# Patient Record
Sex: Male | Born: 1975 | Race: Black or African American | Hispanic: No | Marital: Single | Smoking: Current some day smoker
Health system: Southern US, Community
[De-identification: ages and names within clinical notes are randomized; demographics above are authoritative.]

## PROBLEM LIST (undated history)

## (undated) DIAGNOSIS — I10 Essential (primary) hypertension: Secondary | ICD-10-CM

## (undated) DIAGNOSIS — E789 Disorder of lipoprotein metabolism, unspecified: Secondary | ICD-10-CM

---

## 2006-04-25 ENCOUNTER — Emergency Department (HOSPITAL_COMMUNITY): Admission: EM | Admit: 2006-04-25 | Discharge: 2006-04-25 | Payer: Self-pay | Admitting: Emergency Medicine

## 2008-03-23 ENCOUNTER — Inpatient Hospital Stay (HOSPITAL_COMMUNITY): Admission: EM | Admit: 2008-03-23 | Discharge: 2008-03-24 | Payer: Self-pay | Admitting: Emergency Medicine

## 2008-05-05 ENCOUNTER — Ambulatory Visit (HOSPITAL_BASED_OUTPATIENT_CLINIC_OR_DEPARTMENT_OTHER): Admission: RE | Admit: 2008-05-05 | Discharge: 2008-05-05 | Payer: Self-pay | Admitting: Otolaryngology
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
## 2010-06-13 ENCOUNTER — Emergency Department (HOSPITAL_COMMUNITY): Admission: EM | Admit: 2010-06-13 | Discharge: 2010-06-13 | Payer: Self-pay | Admitting: Emergency Medicine

## 2011-01-14 LAB — DIFFERENTIAL
Basophils Absolute: 0.1 10*3/uL (ref 0.0–0.1)
Basophils Relative: 1 % (ref 0–1)
Eosinophils Relative: 1 % (ref 0–5)
Lymphocytes Relative: 43 % (ref 12–46)
Lymphs Abs: 6.4 10*3/uL — ABNORMAL HIGH (ref 0.7–4.0)
Monocytes Absolute: 1 10*3/uL (ref 0.1–1.0)
Neutro Abs: 7.2 10*3/uL (ref 1.7–7.7)

## 2011-01-14 LAB — BLOOD GAS, ARTERIAL
Acid-base deficit: 1 mmol/L (ref 0.0–2.0)
O2 Saturation: 93.3 %
Patient temperature: 37
pO2, Arterial: 64.6 mmHg — ABNORMAL LOW (ref 80.0–100.0)

## 2011-01-14 LAB — BASIC METABOLIC PANEL
BUN: 16 mg/dL (ref 6–23)
CO2: 25 mEq/L (ref 19–32)
Calcium: 8.4 mg/dL (ref 8.4–10.5)
GFR calc non Af Amer: >60 mL/min (ref >60)
Potassium: 3.3 mEq/L — ABNORMAL LOW (ref 3.5–5.1)
Sodium: 139 mEq/L (ref 135–145)

## 2011-01-14 LAB — ETHANOL: Alcohol, Ethyl (B): <5 mg/dL (ref 0–10)

## 2011-01-14 LAB — CBC
Hemoglobin: 13.7 g/dL (ref 13.0–17.0)
MCH: 30.9 pg (ref 26.0–34.0)
MCV: 90.7 fL (ref 78.0–100.0)
Platelets: 175 10*3/uL (ref 150–400)
RBC: 4.45 MIL/uL (ref 4.22–5.81)
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
## 2011-07-27 LAB — DIFFERENTIAL
Eosinophils Absolute: 0
Lymphs Abs: 1.4
Neutro Abs: 11.4 — ABNORMAL HIGH

## 2011-07-27 LAB — BASIC METABOLIC PANEL
BUN: 11
Calcium: 9.1
Chloride: 103
Creatinine, Ser: 1.12
Potassium: 3.7

## 2011-07-27 LAB — CBC
MCHC: 34.8
MCV: 89.9
Platelets: 150
RBC: 5.25

## 2019-08-16 ENCOUNTER — Emergency Department (HOSPITAL_COMMUNITY)
Admission: EM | Admit: 2019-08-16 | Discharge: 2019-08-17 | Disposition: A | Discharge status: Home / Self Care | Attending: Emergency Medicine | Admitting: Emergency Medicine

## 2019-08-16 ENCOUNTER — Other Ambulatory Visit: Payer: Self-pay

## 2019-08-16 ENCOUNTER — Emergency Department (HOSPITAL_COMMUNITY)

## 2019-08-16 ENCOUNTER — Encounter (HOSPITAL_COMMUNITY): Payer: Self-pay

## 2019-08-16 DIAGNOSIS — J9801 Acute bronchospasm: Secondary | ICD-10-CM | POA: Insufficient documentation

## 2019-08-16 DIAGNOSIS — I10 Essential (primary) hypertension: Secondary | ICD-10-CM | POA: Diagnosis not present

## 2019-08-16 DIAGNOSIS — F17210 Nicotine dependence, cigarettes, uncomplicated: Secondary | ICD-10-CM | POA: Insufficient documentation

## 2019-08-16 DIAGNOSIS — R519 Headache, unspecified: Secondary | ICD-10-CM | POA: Insufficient documentation

## 2019-08-16 DIAGNOSIS — R0789 Other chest pain: Secondary | ICD-10-CM | POA: Diagnosis not present

## 2019-08-16 DIAGNOSIS — R079 Chest pain, unspecified: Secondary | ICD-10-CM | POA: Diagnosis present

## 2019-08-16 HISTORY — DX: Disorder of lipoprotein metabolism, unspecified: E78.9

## 2019-08-16 HISTORY — DX: Essential (primary) hypertension: I10

## 2019-08-16 LAB — BASIC METABOLIC PANEL WITH GFR
Anion gap: 10 (ref 5–15)
BUN: 14 mg/dL (ref 6–20)
CO2: 23 mmol/L (ref 22–32)
Calcium: 8.8 mg/dL — ABNORMAL LOW (ref 8.9–10.3)
Chloride: 102 mmol/L (ref 98–111)
Creatinine, Ser: 1.33 mg/dL — ABNORMAL HIGH (ref 0.61–1.24)
GFR calc Af Amer: >60 mL/min
GFR calc non Af Amer: >60 mL/min
Glucose, Bld: 104 mg/dL — ABNORMAL HIGH (ref 70–99)
Potassium: 3.5 mmol/L (ref 3.5–5.1)
Sodium: 135 mmol/L (ref 135–145)

## 2019-08-16 LAB — CBC
HCT: 51.3 % (ref 39.0–52.0)
Hemoglobin: 16.9 g/dL (ref 13.0–17.0)
MCH: 29.2 pg (ref 26.0–34.0)
MCHC: 32.9 g/dL (ref 30.0–36.0)
MCV: 88.8 fL (ref 80.0–100.0)
Platelets: 195 10*3/uL (ref 150–400)
RBC: 5.78 MIL/uL (ref 4.22–5.81)
RDW: 13.8 % (ref 11.5–15.5)
WBC: 5 10*3/uL (ref 4.0–10.5)
nRBC: 0 % (ref 0.0–0.2)

## 2019-08-16 LAB — TROPONIN I (HIGH SENSITIVITY): Troponin I (High Sensitivity): 3 ng/L (ref <18)

## 2019-08-16 LAB — D-DIMER, QUANTITATIVE: D-Dimer, Quant: 0.32 ug/mL-FEU (ref 0.00–0.50)

## 2019-08-16 NOTE — ED Triage Notes (Signed)
Pt reports chest pain that started two hours PTA.  Pain located under left breast, pt says hurts with expiration, explains "can't breathe out amount that I breathe in without pain." "feels like someone is squeezing it". Pt reports shortness of breath feeling with exhalation.  Pt reports headache x 6 months as well. Pt reports being tested for covid on Wednesday and says he has not received results yet. Was tested due to a low grade fever per report. "99...something"

## 2019-08-17 LAB — TROPONIN I (HIGH SENSITIVITY): Troponin I (High Sensitivity): 3 ng/L (ref <18)

## 2019-08-17 MED ORDER — ALBUTEROL SULFATE HFA 108 (90 BASE) MCG/ACT IN AERS
8.0000 | INHALATION_SPRAY | Freq: Once | RESPIRATORY_TRACT | Status: AC
  Administered 2019-08-17: 8 via RESPIRATORY_TRACT
  Filled 2019-08-17: qty 6.7

## 2019-08-17 MED ORDER — DIPHENHYDRAMINE HCL 50 MG/ML IJ SOLN
25.0000 mg | Freq: Once | INTRAMUSCULAR | Status: AC
  Administered 2019-08-17: 25 mg via INTRAVENOUS
  Filled 2019-08-17: qty 1

## 2019-08-17 MED ORDER — METOCLOPRAMIDE HCL 5 MG/ML IJ SOLN
10.0000 mg | Freq: Once | INTRAMUSCULAR | Status: AC
  Administered 2019-08-17: 10 mg via INTRAVENOUS
  Filled 2019-08-17: qty 2

## 2019-08-17 MED ORDER — DEXAMETHASONE SODIUM PHOSPHATE 10 MG/ML IJ SOLN
10.0000 mg | Freq: Once | INTRAMUSCULAR | Status: AC
  Administered 2019-08-17: 10 mg via INTRAVENOUS
  Filled 2019-08-17: qty 1

## 2019-08-17 MED ORDER — SODIUM CHLORIDE 0.9 % IV BOLUS
1000.0000 mL | Freq: Once | INTRAVENOUS | Status: AC
  Administered 2019-08-17: 1000 mL via INTRAVENOUS

## 2019-08-17 MED ORDER — IBUPROFEN 400 MG PO TABS
600.0000 mg | ORAL_TABLET | Freq: Once | ORAL | Status: AC
  Administered 2019-08-17: 600 mg via ORAL
  Filled 2019-08-17: qty 2
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
## 2019-08-17 NOTE — Discharge Instructions (Addendum)
Use the inhaler 2 puffs every 4-6 hrs for shortness of breath or chest tightness. Drink plenty of fluids. You can ask for ibuprofen or acetaminophen for body aches.  They can add medications if your Covid test is positive.

## 2019-08-17 NOTE — ED Notes (Signed)
Verified with pt that he got tested for Covid Wednesday and results are still pending- Dr Tomi Bamberger made aware- will discontinue SARS Coronavirus test

## 2019-08-17 NOTE — ED Notes (Signed)
Pt given water per request

## 2019-08-17 NOTE — ED Notes (Signed)
Pt given graham crackers per request.  

## 2019-08-17 NOTE — ED Notes (Signed)
RT aware of need for albuterol MDI treatment.
